# Patient Record
Sex: Male | Born: 1995 | Race: Black or African American | Hispanic: No | Marital: Married | Smoking: Current every day smoker
Health system: Southern US, Community
[De-identification: ages and names within clinical notes are randomized; demographics above are authoritative.]

## PROBLEM LIST (undated history)

## (undated) DIAGNOSIS — F419 Anxiety disorder, unspecified: Secondary | ICD-10-CM

## (undated) DIAGNOSIS — R569 Unspecified convulsions: Secondary | ICD-10-CM

---
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
## 2013-08-06 ENCOUNTER — Emergency Department: Payer: Self-pay | Admitting: Emergency Medicine

## 2014-03-11 ENCOUNTER — Emergency Department: Payer: Self-pay | Admitting: Emergency Medicine

## 2014-06-06 ENCOUNTER — Emergency Department: Payer: Self-pay | Admitting: Internal Medicine

## 2015-03-01 ENCOUNTER — Emergency Department: Payer: No Typology Code available for payment source

## 2015-03-01 ENCOUNTER — Emergency Department
Admission: EM | Admit: 2015-03-01 | Discharge: 2015-03-01 | Disposition: A | Discharge status: Home / Self Care | Payer: No Typology Code available for payment source | Attending: Emergency Medicine | Admitting: Emergency Medicine

## 2015-03-01 DIAGNOSIS — S20211A Contusion of right front wall of thorax, initial encounter: Secondary | ICD-10-CM | POA: Insufficient documentation

## 2015-03-01 DIAGNOSIS — S0083XA Contusion of other part of head, initial encounter: Secondary | ICD-10-CM | POA: Diagnosis not present

## 2015-03-01 DIAGNOSIS — Z720 Tobacco use: Secondary | ICD-10-CM | POA: Insufficient documentation

## 2015-03-01 DIAGNOSIS — Y92410 Unspecified street and highway as the place of occurrence of the external cause: Secondary | ICD-10-CM | POA: Insufficient documentation

## 2015-03-01 DIAGNOSIS — Y9389 Activity, other specified: Secondary | ICD-10-CM | POA: Insufficient documentation

## 2015-03-01 DIAGNOSIS — Y998 Other external cause status: Secondary | ICD-10-CM | POA: Insufficient documentation

## 2015-03-01 DIAGNOSIS — S161XXA Strain of muscle, fascia and tendon at neck level, initial encounter: Secondary | ICD-10-CM | POA: Insufficient documentation

## 2015-03-01 DIAGNOSIS — S39012A Strain of muscle, fascia and tendon of lower back, initial encounter: Secondary | ICD-10-CM

## 2015-03-01 DIAGNOSIS — S0990XA Unspecified injury of head, initial encounter: Secondary | ICD-10-CM | POA: Diagnosis present

## 2015-03-01 HISTORY — DX: Anxiety disorder, unspecified: F41.9

## 2015-03-01 MED ORDER — NAPROXEN 500 MG PO TABS: 500.0000 mg | ORAL_TABLET | Freq: Two times a day (BID) | ORAL | Status: DC

## 2015-03-01 MED ORDER — CYCLOBENZAPRINE HCL 10 MG PO TABS: 10.0000 mg | ORAL_TABLET | Freq: Every day | ORAL | Status: DC
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
## 2015-03-01 NOTE — Discharge Instructions (Signed)
Cervical Sprain  A cervical sprain is an injury in the neck in which the strong, fibrous tissues (ligaments) that connect your neck bones stretch or tear. Cervical sprains can range from mild to severe. Severe cervical sprains can cause the neck vertebrae to be unstable. This can lead to damage of the spinal cord and can result in serious nervous system problems. The amount of time it takes for a cervical sprain to get better depends on the cause and extent of the injury. Most cervical sprains heal in 1 to 3 weeks.  CAUSES   Severe cervical sprains may be caused by:    Contact sport injuries (such as from football, rugby, wrestling, hockey, auto racing, gymnastics, diving, martial arts, or boxing).    Motor vehicle collisions.    Whiplash injuries. This is an injury from a sudden forward and backward whipping movement of the head and neck.   Falls.   Mild cervical sprains may be caused by:    Being in an awkward position, such as while cradling a telephone between your ear and shoulder.    Sitting in a chair that does not offer proper support.    Working at a poorly designed computer station.    Looking up or down for long periods of time.   SYMPTOMS    Pain, soreness, stiffness, or a burning sensation in the front, back, or sides of the neck. This discomfort may develop immediately after the injury or slowly, 24 hours or more after the injury.    Pain or tenderness directly in the middle of the back of the neck.    Shoulder or upper back pain.    Limited ability to move the neck.    Headache.    Dizziness.    Weakness, numbness, or tingling in the hands or arms.    Muscle spasms.    Difficulty swallowing or chewing.    Tenderness and swelling of the neck.   DIAGNOSIS   Most of the time your health care provider can diagnose a cervical sprain by taking your history and doing a physical exam. Your health care provider will ask about previous neck injuries and any known neck  problems, such as arthritis in the neck. X-rays may be taken to find out if there are any other problems, such as with the bones of the neck. Other tests, such as a CT scan or MRI, may also be needed.   TREATMENT   Treatment depends on the severity of the cervical sprain. Mild sprains can be treated with rest, keeping the neck in place (immobilization), and pain medicines. Severe cervical sprains are immediately immobilized. Further treatment is done to help with pain, muscle spasms, and other symptoms and may include:   Medicines, such as pain relievers, numbing medicines, or muscle relaxants.    Physical therapy. This may involve stretching exercises, strengthening exercises, and posture training. Exercises and improved posture can help stabilize the neck, strengthen muscles, and help stop symptoms from returning.   HOME CARE INSTRUCTIONS    Put ice on the injured area.     Put ice in a plastic bag.     Place a towel between your skin and the bag.     Leave the ice on for 15-20 minutes, 3-4 times a day.    If your injury was severe, you may have been given a cervical collar to wear. A cervical collar is a two-piece collar designed to keep your neck from moving while it heals.      Do not remove the collar unless instructed by your health care provider.    If you have long hair, keep it outside of the collar.    Ask your health care provider before making any adjustments to your collar. Minor adjustments may be required over time to improve comfort and reduce pressure on your chin or on the back of your head.    Ifyou are allowed to remove the collar for cleaning or bathing, follow your health care provider's instructions on how to do so safely.    Keep your collar clean by wiping it with mild soap and water and drying it completely. If the collar you have been given includes removable pads, remove them every 1-2 days and hand wash them with soap and water. Allow them to air dry. They should be completely  dry before you wear them in the collar.    If you are allowed to remove the collar for cleaning and bathing, wash and dry the skin of your neck. Check your skin for irritation or sores. If you see any, tell your health care provider.    Do not drive while wearing the collar.    Only take over-the-counter or prescription medicines for pain, discomfort, or fever as directed by your health care provider.    Keep all follow-up appointments as directed by your health care provider.    Keep all physical therapy appointments as directed by your health care provider.    Make any needed adjustments to your workstation to promote good posture.    Avoid positions and activities that make your symptoms worse.    Warm up and stretch before being active to help prevent problems.   SEEK MEDICAL CARE IF:    Your pain is not controlled with medicine.    You are unable to decrease your pain medicine over time as planned.    Your activity level is not improving as expected.   SEEK IMMEDIATE MEDICAL CARE IF:    You develop any bleeding.   You develop stomach upset.   You have signs of an allergic reaction to your medicine.    Your symptoms get worse.    You develop new, unexplained symptoms.    You have numbness, tingling, weakness, or paralysis in any part of your body.   MAKE SURE YOU:    Understand these instructions.   Will watch your condition.   Will get help right away if you are not doing well or get worse.     This information is not intended to replace advice given to you by your health care provider. Make sure you discuss any questions you have with your health care provider.     Document Released: 02/04/2007 Document Revised: 04/14/2013 Document Reviewed: 10/15/2012  Elsevier Interactive Patient Education 2016 [employer]
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
## 2015-04-03 ENCOUNTER — Emergency Department
Admission: EM | Admit: 2015-04-03 | Discharge: 2015-04-03 | Disposition: A | Discharge status: Other Acute Inpatient Hospital | Payer: Self-pay | Attending: Emergency Medicine | Admitting: Emergency Medicine

## 2015-04-03 DIAGNOSIS — F32A Depression, unspecified: Secondary | ICD-10-CM

## 2015-04-03 DIAGNOSIS — X838XXA Intentional self-harm by other specified means, initial encounter: Secondary | ICD-10-CM | POA: Insufficient documentation

## 2015-04-03 DIAGNOSIS — F1210 Cannabis abuse, uncomplicated: Secondary | ICD-10-CM | POA: Insufficient documentation

## 2015-04-03 DIAGNOSIS — Y998 Other external cause status: Secondary | ICD-10-CM | POA: Insufficient documentation

## 2015-04-03 DIAGNOSIS — Y9389 Activity, other specified: Secondary | ICD-10-CM | POA: Insufficient documentation

## 2015-04-03 DIAGNOSIS — Z880 Allergy status to penicillin: Secondary | ICD-10-CM | POA: Insufficient documentation

## 2015-04-03 DIAGNOSIS — Y9289 Other specified places as the place of occurrence of the external cause: Secondary | ICD-10-CM | POA: Insufficient documentation

## 2015-04-03 DIAGNOSIS — Z046 Encounter for general psychiatric examination, requested by authority: Secondary | ICD-10-CM

## 2015-04-03 DIAGNOSIS — F329 Major depressive disorder, single episode, unspecified: Secondary | ICD-10-CM | POA: Insufficient documentation

## 2015-04-03 DIAGNOSIS — F17200 Nicotine dependence, unspecified, uncomplicated: Secondary | ICD-10-CM | POA: Insufficient documentation

## 2015-04-03 DIAGNOSIS — S61512A Laceration without foreign body of left wrist, initial encounter: Secondary | ICD-10-CM | POA: Insufficient documentation

## 2015-04-03 DIAGNOSIS — R45851 Suicidal ideations: Secondary | ICD-10-CM

## 2015-04-03 DIAGNOSIS — F1310 Sedative, hypnotic or anxiolytic abuse, uncomplicated: Secondary | ICD-10-CM | POA: Insufficient documentation

## 2015-04-03 LAB — COMPREHENSIVE METABOLIC PANEL
ALT: 26 U/L (ref 17–63)
AST: 24 U/L (ref 15–41)
Albumin: 5.1 g/dL — ABNORMAL HIGH (ref 3.5–5.0)
Alkaline Phosphatase: 37 U/L — ABNORMAL LOW (ref 38–126)
Anion gap: 8 (ref 5–15)
BILIRUBIN TOTAL: 0.7 mg/dL (ref 0.3–1.2)
BUN: 11 mg/dL (ref 6–20)
CALCIUM: 9.9 mg/dL (ref 8.9–10.3)
CHLORIDE: 105 mmol/L (ref 101–111)
CO2: 28 mmol/L (ref 22–32)
CREATININE: 0.96 mg/dL (ref 0.61–1.24)
Glucose, Bld: 91 mg/dL (ref 65–99)
Potassium: 3.4 mmol/L — ABNORMAL LOW (ref 3.5–5.1)
Sodium: 141 mmol/L (ref 135–145)
TOTAL PROTEIN: 8.3 g/dL — AB (ref 6.5–8.1)

## 2015-04-03 LAB — CBC
HCT: 47.3 % (ref 40.0–52.0)
Hemoglobin: 15.6 g/dL (ref 13.0–18.0)
MCH: 30.5 pg (ref 26.0–34.0)
MCHC: 33.1 g/dL (ref 32.0–36.0)
MCV: 92.1 fL (ref 80.0–100.0)
PLATELETS: 186 10*3/uL (ref 150–440)
RBC: 5.13 MIL/uL (ref 4.40–5.90)
RDW: 13.1 % (ref 11.5–14.5)
WBC: 5.4 10*3/uL (ref 3.8–10.6)

## 2015-04-03 LAB — URINE DRUG SCREEN, QUALITATIVE (ARMC ONLY)
Amphetamines, Ur Screen: NOT DETECTED
BARBITURATES, UR SCREEN: NOT DETECTED
Benzodiazepine, Ur Scrn: POSITIVE — AB
CANNABINOID 50 NG, UR ~~LOC~~: POSITIVE — AB
Cocaine Metabolite,Ur ~~LOC~~: NOT DETECTED
MDMA (ECSTASY) UR SCREEN: NOT DETECTED
Methadone Scn, Ur: NOT DETECTED
Opiate, Ur Screen: NOT DETECTED
PHENCYCLIDINE (PCP) UR S: NOT DETECTED
TRICYCLIC, UR SCREEN: NOT DETECTED

## 2015-04-03 LAB — SALICYLATE LEVEL

## 2015-04-03 LAB — ETHANOL

## 2015-04-03 LAB — ACETAMINOPHEN LEVEL: Acetaminophen (Tylenol), Serum: <10 ug/mL — ABNORMAL LOW (ref 10–30)

## 2015-04-03 MED ORDER — NICOTINE 14 MG/24HR TD PT24
14.0000 mg | MEDICATED_PATCH | Freq: Once | TRANSDERMAL | Status: DC
  Administered 2015-04-03: 14 mg via TRANSDERMAL

## 2015-04-03 MED ORDER — NICOTINE 14 MG/24HR TD PT24
MEDICATED_PATCH | TRANSDERMAL | Status: AC
  Administered 2015-04-03: 14 mg via TRANSDERMAL
  Filled 2015-04-03: qty 1

## 2015-04-03 NOTE — ED Notes (Signed)
Patient asleep in room. No noted distress or abnormal behavior. Will continue 15 minute checks and observation by security cameras for safety. 

## 2015-04-03 NOTE — ED Notes (Signed)
[employer] officer Claudette LawsWatson who brought pt to the ED was still in the department completing the paperwork and this RN notified him about the patients ankle monitoring device and he will attempt to either get the charger or notify Probation officer that patient is here.

## 2015-04-03 NOTE — ED Notes (Signed)
Patient resting quietly in room. Does not show any signs of distress at the moment. Patient seems to be more calm and cooperative with the plan of care. Maintained on 15 minute checks and observation by security camera for safety.

## 2015-04-03 NOTE — ED Notes (Signed)
BEHAVIORAL HEALTH ROUNDING  Patient sleeping: No.  Patient alert and oriented: yes  Behavior appropriate: Yes. ; If no, describe:  Nutrition and fluids offered: Yes  Toileting and hygiene offered: Yes  Sitter present: not applicable  Law enforcement present: Yes ODS  

## 2015-04-03 NOTE — ED Notes (Signed)
Patient currently meeting with visitor. No signs of distress noted. Maintained on 15 minute checks and observation by security camera for safety.

## 2015-04-03 NOTE — ED Notes (Signed)
BEHAVIORAL HEALTH ROUNDING Patient sleeping: No Patient alert and oriented: Yes Behavior appropriate: Yes.  ; If no, describe: Nutrition and fluids offered: Yes Toileting and hygiene offered: Yes  Sitter present: no Law enforcement present: Yes  and ODS  ENVIRONMENTAL ASSESSMENT Potentially harmful objects out of patient reach: Yes.   Personal belongings secured: Yes.   Patient dressed in hospital provided attire only: Yes.   Plastic bags out of patient reach: Yes.   Patient care equipment (cords, cables, call bells, lines, and drains) shortened, removed, or accounted for: Yes.   Equipment and supplies removed from bottom of stretcher: Yes.   Potentially toxic materials out of patient reach: Yes.   Sharps container removed or out of patient reach: Yes.     ED BHU PLACEMENT JUSTIFICATION Is the patient under IVC or is there intent for IVC: Yes Is the patient medically cleared: Yes.   Is there vacancy in the ED BHU: Yes.   Is the population mix appropriate for patient: Yes.   Is the patient awaiting placement in inpatient or outpatient setting: No. Has the patient had a psychiatric consult: Yes.   Survey of unit performed for contraband, proper placement and condition of furniture, tampering with fixtures in bathroom, shower, and each patient room: Yes.  ; Findings: all clear  APPEARANCE/BEHAVIOR calm, cooperative and adequate rapport can be established NEURO ASSESSMENT Orientation: time, place and person Hallucinations: No.None noted (Hallucinations) Speech: Normal Gait: normal RESPIRATORY ASSESSMENT Normal expansion.  Clear to auscultation.  No rales, rhonchi, or wheezing. CARDIOVASCULAR ASSESSMENT regular rate and rhythm, S1, S2 normal, no murmur, click, rub or gallop GASTROINTESTINAL ASSESSMENT soft, nontender, BS WNL, no r/g EXTREMITIES normal strength, tone, and muscle mass, no deformities, no erythema, induration, or nodules, no evidence of joint effusion, ROM of all  joints is normal, no evidence of joint instability PLAN OF CARE Provide calm/safe environment. Vital signs assessed twice daily. ED BHU Assessment once each 12-hour shift. Collaborate with intake RN daily or as condition indicates. Assure the ED provider has rounded once each shift. Provide and encourage hygiene. Provide redirection as needed. Assess for escalating behavior; address immediately and inform ED provider.  Assess family dynamic and appropriateness for visitation as needed: Yes.  ; If necessary, describe findings: na Educate the patient/family about BHU procedures/visitation: Yes.  ; If necessary, describe findings: na

## 2015-04-03 NOTE — ED Notes (Signed)
Before discharge nurse applied steri strips to patient wounds. Wounds were clean, dry and intact. Probation officer notified to place bracelet back on patient. Patient was escorted by security to lobby to have bracelet placed by probation officer under ED officer supervision.
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
## 2015-04-03 NOTE — BH Assessment (Signed)
Assessment Note  James Turner is an 19 y.o. male presenting to ED under IVC with multiple self-inflicted lacerations to his left wrist. Pt denies SI and states "That's the only reason why I cut my self, cause I was looking for attention". Pt denies HI and hallucinations. Pt reports hx GAD. Pt reports that he is currently on probation and is now unable to secure employment due to recently obtained criminal record. Pt reports difficulty sleeping due to anxiety and racing thoughts. Pt denies any SA hx however, references obtaining "something" from friends to assist with anxiety and sleep. Pt has family hx of suicide (brother). Pt reports no difficulties performing ADLs.  Diagnosis: GAD (Pt Report)  Past Medical History:  Past Medical History  Diagnosis Date  . Anxiety     No past surgical history on file.  Family History: No family history on file.  Social History:  reports that he has been smoking.  He does not have any smokeless tobacco history on file. He reports that he does not drink alcohol or use illicit drugs.  Additional Social History:  Alcohol / Drug Use Pain Medications: None Reported Prescriptions: None Reported Over the Counter: None Reported History of alcohol / drug use?: No history of alcohol / drug abuse  CIWA: CIWA-Ar BP: 114/61 mmHg Pulse Rate: 79 COWS:    Allergies:  Allergies  Allergen Reactions  . Penicillins Hives    Has patient had a PCN reaction causing immediate rash, facial/tongue/throat swelling, SOB or lightheadedness with hypotension: no:30480221} Has patient had a PCN reaction causing severe rash involving mucus membranes or skin necrosis: no:[id number]} Has patient had a PCN reaction that required hospitalization no:30480221} Has patient had a PCN reaction occurring within the last 10 years: no:[id number]} If all of the above answers are "NO", then may proceed with Cephalosporin use.     Home Medications:  (Not in a hospital  admission)  OB/GYN Status:  No LMP for male patient.  General Assessment Data Location of Assessment: [facility] [address] ED TTS Assessment: In system Is this a Tele or Face-to-Face Assessment?: Face-to-Face Is this an Initial Assessment or a Re-assessment for this encounter?: Initial Assessment Marital status: Single Maiden name: NA Is patient pregnant?: No Pregnancy Status: No Living Arrangements: Spouse/significant other Can pt return to current living arrangement?: Yes Admission Status: Involuntary Is patient capable of signing voluntary admission?: No Referral Source: Other Insurance type: None  Medical Screening Exam [facility] Walk-in ONLY) Medical Exam completed: Yes  Crisis Care Plan Living Arrangements: Spouse/significant other Name of Psychiatrist: None Name of Therapist: None  Education Status Is patient currently in school?: Yes Current Grade: GED Program Highest grade of school patient has completed: [employer] High [employer] Name of school: [employer] Contact person: None Provided  Risk to self with the past 6 months Suicidal Ideation: No Has patient been a risk to self within the past 6 months prior to admission? : No Suicidal Intent: No Has patient had any suicidal intent within the past 6 months prior to admission? : No Is patient at risk for suicide?: No Suicidal Plan?: No Has patient had any suicidal plan within the past 6 months prior to admission? 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 : No Thoughts of Harm to Others: No Current Homicidal Intent: No Current Homicidal Plan: No Access to Homicidal Means: No Identified Victim: NA History of harm to others?: No Assessment of Violence: None Noted Violent Behavior Description: NA Does patient have access to weapons?: Yes (Comment) (Access to sharp objects) Criminal Charges Pending?: Yes Describe Pending Criminal Charges: Carrying Concealed Gun Does patient have a court date: Yes Court Date: 05/02/14 Is patient on probation?: Yes  Psychosis Hallucinations: None noted Delusions: None noted  Mental Status Report Appearance/Hygiene: Unable to Assess (Pt remained in bed under covers) Eye Contact: Fair Motor Activity: Unremarkable Speech: Logical/coherent Level of Consciousness: Quiet/awake Mood: Depressed, Anxious, Helpless, Irritable Affect: Depressed, Angry, Appropriate to circumstance Anxiety Level: Minimal Thought Processes: Coherent, Relevant Judgement: Partial Orientation: Person, Place, Time, Situation, Appropriate for developmental age Obsessive Compulsive Thoughts/Behaviors: None  Cognitive Functioning Concentration: Normal Memory: Recent Intact, Remote Intact IQ: Average Insight: Fair Impulse Control: Poor Appetite: Fair Weight Loss:  (While incarcerated) Weight Gain: 0 Sleep: Decreased Total Hours of Sleep:  (Not Provided) Vegetative Symptoms:  (Not Reported)  ADLScreening [facility] Services) Patient's cognitive ability adequate to safely complete daily activities?: Yes Patient able to express need for assistance with  ADLs?: Yes Independently performs ADLs?: Yes (appropriate for developmental age)  Prior Inpatient Therapy Prior Inpatient Therapy: No Prior Therapy Dates: NA Prior Therapy Facilty/Provider(s): NA Reason for Treatment: NA  Prior Outpatient Therapy Prior Outpatient Therapy: No Prior Therapy Dates: NA Prior Therapy Facilty/Provider(s): NA Reason for Treatment: NA Does patient have an ACCT team?: No Does patient have Intensive In-House Services?  : No Does patient have [facility] services? : No Does patient have P4CC services?: No  ADL Screening (condition at time of admission) Patient's cognitive ability adequate to safely complete daily activities?: Yes Is the patient deaf or have difficulty hearing?: No Does the patient have difficulty seeing, even when wearing glasses/contacts?: No Does the patient have difficulty concentrating, remembering, or making decisions?: No Patient able to express need for assistance with ADLs?: Yes Does the patient have difficulty dressing or bathing?: No Independently performs ADLs?: Yes (appropriate for developmental age) Does the patient have difficulty walking or climbing stairs?: No Weakness of Legs: None Weakness of Arms/Hands: None  Home Assistive Devices/Equipment Home Assistive Devices/Equipment: None  Therapy Consults (therapy consults require a physician order) PT Evaluation Needed: No OT Evalulation Needed: No SLP Evaluation Needed: No Abuse/Neglect Assessment (Assessment to be complete while patient is alone) Physical Abuse: Denies Verbal Abuse: Denies Sexual Abuse: Denies Exploitation of patient/patient's resources: Denies Self-Neglect: Denies Values / Beliefs Cultural Requests During Hospitalization: None Spiritual Requests During Hospitalization: None Consults Spiritual Care Consult Needed: No Social Work Consult Needed: No Merchant navy officer (For Healthcare) Does patient have an advance directive?: No Would patient like  information on creating an advanced directive?: No - patient declined information    Additional Information 1:1 In Past 12 Months?: No CIRT Risk: No Elopement Risk: No Does patient have medical clearance?: No     Disposition:  Disposition Initial Assessment Completed for this Encounter: Yes Disposition of  Patient: Other dispositions (Psych MD Consult)  On Site Evaluation by:   Reviewed with Physician:    Ebin Palazzi J [place] 04/03/2015 3:04 AM

## 2015-04-03 NOTE — ED Notes (Signed)
ENVIRONMENTAL ASSESSMENT Potentially harmful objects out of patient reach: Yes Personal belongings secured: Yes Patient dressed in hospital provided attire only: Yes Plastic bags out of patient reach: Yes Patient care equipment (cords, cables, call bells, lines, and drains) shortened, removed, or accounted for: Yes Equipment and supplies removed from bottom of stretcher: Yes Potentially toxic materials out of patient reach: Yes Sharps container removed or out of patient reach: Yes  Patient sleeping in room. Patient shows no signs of distress. Maintained on 15 minute checks and observation by security camera for safety.  

## 2015-04-03 NOTE — ED Notes (Signed)
Pt presents for IVC. Pt reports cutting wrists tonight. Superficial lacerations to front and back of left wrist.

## 2015-04-03 NOTE — ED Notes (Addendum)
Patient spoke more with nurse about concerns for being discharged today. Nurse assured patient that psychiatrist would evaluate him and gave him techniques to relieve anxiety. Maintained on 15 minute checks and observation by security camera for safety.

## 2015-04-03 NOTE — ED Notes (Addendum)
Pt reports he has been feeling down on himself because he has felony conviction and is unable to get a job. Pt states he started cutting his wrist tonight because of that but states he was just attention seeking and not really wanting to harm himself. Pt did express to this RN that his brother committed suicide when he (the patient) was around 19 years old. Pt also reports to this RN that he has a tracking device on his ankle because of his felony conviction. Device noted to be giving an alarm to charge the battery at this time and patient concerned about getting in trouble because of not being at home. Explained to patient that we could notify the office that monitors him and let them know he was here if he wanted us to. Pt agreed that we could notify the parole office.

## 2015-04-03 NOTE — ED Provider Notes (Signed)
[facility] Emergency Department Provider Note  ____________________________________________  Time seen: Approximately 2:19 AM  I have reviewed the triage vital signs and the nursing notes.   HISTORY  Chief Complaint Suicidal    HPI James Turner is a 19 y.o. male with a past medical history of anxiety per his report who states that he just wanted some attention tonight so he repeatedly cut his left wrist.  However he was placed under involuntary commitment for the self injury and suicidal ideation and attempt.He denies any suicidal ideation but does have numerous superficial cuts on his left wrist.  He says he is going through "some stuff" that is difficult to do with and that is leading to his worsening mental status.  The onset has been gradual and the symptoms are severe.  He denies any hallucinations.  He denies any desire to hurt anyone else.  He is denying suicidal ideation at this time.  Of note, the patient does not think he is up-to-date on his tetanus but he is refusing the shot because he feels that it is dangerous and that "sometimes they put stuff in it".   Past Medical History  Diagnosis Date  . Anxiety     There are no active problems to display for this patient.   No past surgical history on file.  No current outpatient prescriptions on file.  Allergies Penicillins  No family history on file.  Social History Social History  Substance Use Topics  . Smoking status: Current Every Day Smoker  . Smokeless tobacco: Not on file  . Alcohol Use: No    Review of Systems Constitutional: No fever/chills Eyes: No visual changes. ENT: No sore throat. Cardiovascular: Denies chest pain. Respiratory: Denies shortness of breath. Gastrointestinal: No abdominal pain.  No nausea, no vomiting.  No diarrhea.  No constipation. Genitourinary: Negative for dysuria. Musculoskeletal: Negative for back pain. Skin: Numerous superficial wounds to  his left wrist Neurological: Negative for headaches, focal weakness or numbness.  10-point ROS otherwise negative.  ____________________________________________   PHYSICAL EXAM:  VITAL SIGNS: ED Triage Vitals  Enc Vitals Group     BP 04/03/15 0026 114/61 mmHg     Pulse Rate 04/03/15 0026 79     Resp 04/03/15 0026 16     Temp 04/03/15 0026 98.4 F (36.9 C)     Temp Source 04/03/15 0026 Oral     SpO2 04/03/15 0026 97 %     Weight 04/03/15 0026 155 lb (70.308 kg)     Height 04/03/15 0026  (1.778 m)     Head Cir --      Peak Flow --      Pain Score --      Pain Loc --      Pain Edu? --      Excl. in GC? --     Constitutional: Alert and oriented. Well appearing and in no acute distress. Eyes: Conjunctivae are normal. PERRL. EOMI. Head: Atraumatic. Nose: No congestion/rhinnorhea. Mouth/Throat: Mucous membranes are moist.  Oropharynx non-erythematous. Neck: No stridor.   Cardiovascular: Normal rate, regular rhythm. Grossly normal heart sounds.  Good peripheral circulation. Respiratory: Normal respiratory effort.  No retractions. Lungs CTAB. Gastrointestinal: Soft and nontender. No distention. No abdominal bruits. No CVA tenderness. Musculoskeletal: No lower extremity tenderness nor edema.  No joint effusions. Neurologic:  Normal speech and language. No gross focal neurologic deficits are appreciated.  Skin:  Skin is warm and dry.  Numerous linear superficial wounds to his  left wrist, nothing down into the subcutaneous tissue. Psychiatric: Mood and affect are flat. Speech and behavior are normal.  Denies SI/HI  ____________________________________________   LABS (all labs ordered are listed, but only abnormal results are displayed)  Labs Reviewed  COMPREHENSIVE METABOLIC PANEL - Abnormal; Notable for the following:    Potassium 3.4 (*)    Total Protein 8.3 (*)    Albumin 5.1 (*)    Alkaline Phosphatase 37 (*)    All other components within normal limits   ACETAMINOPHEN LEVEL - Abnormal; Notable for the following:    Acetaminophen (Tylenol), Serum <10 (*)    All other components within normal limits  URINE DRUG SCREEN, QUALITATIVE ([facility] ONLY) - Abnormal; Notable for the following:    Cannabinoid 50 Ng, Ur [place] POSITIVE (*)    Benzodiazepine, Ur Scrn POSITIVE (*)    All other components within normal limits  ETHANOL  SALICYLATE LEVEL  CBC   ____________________________________________  EKG  Not indicated ____________________________________________  RADIOLOGY   No results found.  ____________________________________________   PROCEDURES  Procedure(s) performed: None  Critical Care performed: No ____________________________________________   INITIAL IMPRESSION / ASSESSMENT AND PLAN / ED COURSE  Pertinent labs & imaging results that were available during my care of the patient were reviewed by me and considered in my medical decision making (see chart for details).  We will irrigate wound and closed with Steri-Strips, all the wounds are superficial.  Up holding IVC.  Orders in for psych and TTS.  Patient is refusing Tdap.  Medically cleared.  ____________________________________________  FINAL CLINICAL IMPRESSION(S) / ED DIAGNOSES  Final diagnoses:  Depression  Suicidal ideation  Self-harm, initial encounter  Wrist laceration, left, initial encounter  Involuntary commitment      NEW MEDICATIONS STARTED DURING THIS VISIT:  New Prescriptions   No medications on file     Loleta Roseory Draedyn Weidinger, MD 04/03/15 [phone]

## 2015-04-03 NOTE — ED Notes (Signed)
Received call from Officer Clovis RileyMitchell of [facility] [employer] who reports he had to go to the patients house due to the monitor alarming and that he was told the patient was here and he would come by and remove the device at this time.

## 2015-04-03 NOTE — ED Notes (Signed)
BEHAVIORAL HEALTH ROUNDING Patient sleeping: Yes.   Patient alert and oriented: not applicable SLEEPING Behavior appropriate: Yes.  ; If no, describe: SLEEPING Nutrition and fluids offered: No SLEEPING Toileting and hygiene offered: NoSLEEPING Sitter present: not applicable Law enforcement present: Yes ODS 

## 2015-04-03 NOTE — ED Notes (Signed)
Patient is currently standing in the dayroom speaking with mom on the phone. Patient then talked to staff stating that he really wants to go home in order to prepare for his job interview tomorrow. He adamantly states that he is not suicidal, he needs to go home, and that he simply cut his arm for attention seeking behavior. Maintained on 15 minute checks and observation by security camera for safety.

## 2015-04-03 NOTE — ED Notes (Signed)
Patient mother Learta CoddingJabrina Curto [phone] called to ask about the status of her son. She states that he is just going through a rough time right now and that he cut his arm to attention-seek and not because he had severe suicidal ideation. Mother wishes to be informed of any updates throughout the day and hopes that he will be able to be discharged today.

## 2015-04-03 NOTE — ED Notes (Signed)
Pt transfer from ED22 without incident.  Pt "this place is just like prison"  Patient assigned to appropriate care area. Patient oriented to unit/care area: Informed that, for their safety, care areas are designed for safety and monitored by security cameras at all times; and visiting hours explained to patient. Patient verbalizes understanding, and verbal contract for safety obtained.

## 2015-04-03 NOTE — ED Notes (Signed)

## 2015-04-03 NOTE — ED Notes (Addendum)
Officer Clovis RileyMitchell from [facility] [employer] in to remove ankle monitor from patient and left contact number so that device can be replaced when pt is discharged. Officer Clovis RileyMitchell requested that his office be notified when the patient is discharge or if he is moved to another facility so that the monitor can be replaced when the patient is discharged home. The 24 hour on call phone number for contact is [phone].

## 2015-04-03 NOTE — ED Notes (Signed)
Patient in room watching television. Maintained on 15 minute checks and observation by security camera for safety.

## 2015-04-03 NOTE — Discharge Instructions (Signed)
Major Depressive Disorder °Major depressive disorder is a mental illness. It also may be called clinical depression or unipolar depression. Major depressive disorder usually causes feelings of sadness, hopelessness, or helplessness. Some people with this disorder do not feel particularly sad but lose interest in doing things they used to enjoy (anhedonia). Major depressive disorder also can cause physical symptoms. It can interfere with work, school, relationships, and other normal everyday activities. The disorder varies in severity but is longer lasting and more serious than the sadness we all feel from time to time in our lives. °Major depressive disorder often is triggered by stressful life events or major life changes. Examples of these triggers include divorce, loss of your job or home, a move, and the death of a family member or close friend. Sometimes this disorder occurs for no obvious reason at all. People who have family members with major depressive disorder or bipolar disorder are at higher risk for developing this disorder, with or without life stressors. Major depressive disorder can occur at any age. It may occur just once in your life (single episode major depressive disorder). It may occur multiple times (recurrent major depressive disorder). °SYMPTOMS °People with major depressive disorder have either anhedonia or depressed mood on nearly a daily basis for at least 2 weeks or longer. Symptoms of depressed mood include: °· Feelings of sadness (blue or down in the dumps) or emptiness. °· Feelings of hopelessness or helplessness. °· Tearfulness or episodes of crying (may be observed by others). °· Irritability (children and adolescents). °In addition to depressed mood or anhedonia or both, people with this disorder have at least four of the following symptoms: °· Difficulty sleeping or sleeping too much.   °· Significant change (increase or decrease) in appetite or weight.   °· Lack of energy or  motivation. °· Feelings of guilt and worthlessness.   °· Difficulty concentrating, remembering, or making decisions. °· Unusually slow movement (psychomotor retardation) or restlessness (as observed by others).   °· Recurrent wishes for death, recurrent thoughts of self-harm (suicide), or a suicide attempt. °People with major depressive disorder commonly have persistent negative thoughts about themselves, other people, and the world. People with severe major depressive disorder may experience distorted beliefs or perceptions about the world (psychotic delusions). They also may see or hear things that are not real (psychotic hallucinations). °DIAGNOSIS °Major depressive disorder is diagnosed through an assessment by your health care provider. Your health care provider will ask about aspects of your daily life, such as mood, sleep, and appetite, to see if you have the diagnostic symptoms of major depressive disorder. Your health care provider may ask about your medical history and use of alcohol or drugs, including prescription medicines. Your health care provider also may do a physical exam and blood work. This is because certain medical conditions and the use of certain substances can cause major depressive disorder-like symptoms (secondary depression). Your health care provider also may refer you to a mental health specialist for further evaluation and treatment. °TREATMENT °It is important to recognize the symptoms of major depressive disorder and seek treatment. The following treatments can be prescribed for this disorder:   °· Medicine. Antidepressant medicines usually are prescribed. Antidepressant medicines are thought to correct chemical imbalances in the brain that are commonly associated with major depressive disorder. Other types of medicine may be added if the symptoms do not respond to antidepressant medicines alone or if psychotic delusions or hallucinations occur. °· Talk therapy. Talk therapy can be  helpful in treating major depressive disorder by providing   support, education, and guidance. Certain types of talk therapy also can help with negative thinking (cognitive behavioral therapy) and with relationship issues that trigger this disorder (interpersonal therapy). °A mental health specialist can help determine which treatment is best for you. Most people with major depressive disorder do well with a combination of medicine and talk therapy. Treatments involving electrical stimulation of the brain can be used in situations with extremely severe symptoms or when medicine and talk therapy do not work over time. These treatments include electroconvulsive therapy, transcranial magnetic stimulation, and vagal nerve stimulation. °  °This information is not intended to replace advice given to you by your health care provider. Make sure you discuss any questions you have with your health care provider. °  °Document Released: 08/04/2012 Document Revised: 04/30/2014 Document Reviewed: 08/04/2012 °Elsevier Interactive Patient Education ©2016 [employer]vier Inc. ° °Suicidal Feelings: How to Help Yourself °Suicide is the taking of one's own life. If you feel as though life is getting too tough to handle and are thinking about suicide, get help right away. To get help: °· Call your local emergency services (911 in the [place] °· Call a suicide hotline to speak with a trained counselor who understands how you are feeling. The following is a list of suicide hotlines in the [place]. For a list of hotlines in [place], visit www.suicide.org/hotlines/international/[place]-suicide-hotlines.html. °¨  1-800-273-TALK (1-800-273-8255). °¨  1-800-SUICIDE (1-800-784-2433). °¨  1-888-628-9454. This is a hotline for Spanish speakers. °¨  1-800-799-4TTY (1-800-799-4889). This is a hotline for TTY users. °¨  1-866-4-U-TREVOR (1-866-488-7386). This is a hotline for lesbian, gay, bisexual, transgender, or questioning youth. °· Contact a crisis  center or a local suicide prevention center. To find a crisis center or suicide prevention center: °¨ Call your local hospital, clinic, community service organization, mental health center, social service provider, or health department. Ask for assistance in connecting to a crisis center. °¨ Visit www.suicidepreventionlifeline.org/getinvolved/locator for a list of crisis centers in the [place], or visit www.suicideprevention.[place]/thinking-about-suicide/find-a-crisis-centre for a list of centers in [place]. °· Visit the following websites: °¨  National Suicide Prevention Lifeline: www.suicidepreventionlifeline.org °¨  Hopeline: www.hopeline.com °¨  American Foundation for Suicide Prevention: www.afsp.org °¨  The Trevor Project (for lesbian, gay, bisexual, transgender, or questioning youth): www.thetrevorproject.org °HOW CAN I HELP MYSELF FEEL BETTER? °· Promise yourself that you will not do anything drastic when you have suicidal feelings. Remember, there is hope. Many people have gotten through suicidal thoughts and feelings, and you will, too. You may have gotten through them before, and this proves that you can get through them again. °· Let family, friends, teachers, or counselors know how you are feeling. Try not to isolate yourself from those who care about you. Remember, they will want to help you. Talk with someone every day, even if you do not feel sociable. Face-to-face conversation is best. °· Call a mental health professional and see one regularly. °· Visit your primary health care provider every year. °· Eat a well-balanced diet, and space your meals so you eat regularly. °· Get plenty of rest. °· Avoid alcohol and drugs, and remove them from your home. They will only make you feel worse. °· If you are thinking of taking a lot of medicine, give your medicine to someone who can give it to you one day at a time. If you are on antidepressants and are concerned you will overdose, let your health care  provider know so he or she can give you safer medicines. Ask your mental health professional about the possible side effects of any   medicines you are taking. °· Remove weapons, poisons, knives, and anything else that could harm you from your home. °· Try to stick to routines. Follow a schedule every day. Put self-care on your schedule. °· Make a list of realistic goals, and cross them off when you achieve them. Accomplishments give a sense of worth. °· Wait until you are feeling better before doing the things you find difficult or unpleasant. °· Exercise if you are able. You will feel better if you exercise for even a half hour each day. °· Go out in the sun or into nature. This will help you recover from depression faster. If you have a favorite place to walk, go there. °· Do the things that have always given you pleasure. Play your favorite music, read a good book, paint a picture, play your favorite instrument, or do anything else that takes your mind off your depression if it is safe to do. °· Keep your living space well lit. °· When you are feeling well, write yourself a letter about tips and support that you can read when you are not feeling well. °· Remember that life's difficulties can be sorted out with help. Conditions can be treated. You can work on thoughts and strategies that serve you well. °  °This information is not intended to replace advice given to you by your health care provider. Make sure you discuss any questions you have with your health care provider. °  °Document Released: 10/14/2002 Document Revised: 04/30/2014 Document Reviewed: 08/04/2013 °Elsevier Interactive Patient Education ©2016 Elsevier Inc. ° °

## 2015-04-03 NOTE — ED Notes (Signed)
Report called to Danelle EarthlyNoel, Charity fundraiserN. Pt to be moved to ED BHU.
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
## 2015-04-03 NOTE — ED Notes (Signed)
Patient resting quietly in room. No noted distress or abnormal behaviors noted. Will continue 15 minute checks and observation by security camera for safety. 

## 2015-04-03 NOTE — ED Provider Notes (Signed)
Patient is cleared for discharge by Dr. Guss [place], medically stable.  James FilbertJonathan E Damaree Sargent, MD 04/03/15 [phone]

## 2015-04-03 NOTE — ED Notes (Signed)
Patient currently denies SI/HI/AVH and pain. All belongings returned to patient. Patient discharged to home with mother.

## 2015-09-09 ENCOUNTER — Telehealth (HOSPITAL_BASED_OUTPATIENT_CLINIC_OR_DEPARTMENT_OTHER): Payer: Self-pay

## 2015-09-09 ENCOUNTER — Emergency Department: Payer: No Typology Code available for payment source

## 2015-09-09 ENCOUNTER — Emergency Department
Admission: EM | Admit: 2015-09-09 | Discharge: 2015-09-09 | Disposition: A | Discharge status: Home / Self Care | Payer: No Typology Code available for payment source | Attending: Emergency Medicine | Admitting: Emergency Medicine

## 2015-09-09 ENCOUNTER — Encounter: Payer: Self-pay | Admitting: *Deleted

## 2015-09-09 DIAGNOSIS — Y92410 Unspecified street and highway as the place of occurrence of the external cause: Secondary | ICD-10-CM | POA: Diagnosis not present

## 2015-09-09 DIAGNOSIS — F17200 Nicotine dependence, unspecified, uncomplicated: Secondary | ICD-10-CM | POA: Insufficient documentation

## 2015-09-09 DIAGNOSIS — Y999 Unspecified external cause status: Secondary | ICD-10-CM | POA: Insufficient documentation

## 2015-09-09 DIAGNOSIS — S161XXA Strain of muscle, fascia and tendon at neck level, initial encounter: Secondary | ICD-10-CM | POA: Diagnosis not present

## 2015-09-09 DIAGNOSIS — R0781 Pleurodynia: Secondary | ICD-10-CM | POA: Diagnosis not present

## 2015-09-09 DIAGNOSIS — Y939 Activity, unspecified: Secondary | ICD-10-CM | POA: Diagnosis not present

## 2015-09-09 DIAGNOSIS — M542 Cervicalgia: Secondary | ICD-10-CM | POA: Diagnosis present

## 2015-09-09 MED ORDER — CYCLOBENZAPRINE HCL 10 MG PO TABS: 10.0000 mg | ORAL_TABLET | Freq: Three times a day (TID) | ORAL | Status: DC | PRN

## 2015-09-09 MED ORDER — IBUPROFEN 800 MG PO TABS: 800.0000 mg | ORAL_TABLET | Freq: Three times a day (TID) | ORAL | Status: AC | PRN
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
## 2015-09-09 NOTE — ED Notes (Signed)
Pt discharged home after verbalizing understanding of discharge instructions; nad noted. 

## 2015-10-21 ENCOUNTER — Ambulatory Visit: Payer: Self-pay

## 2015-11-17 ENCOUNTER — Emergency Department: Payer: Self-pay

## 2015-11-17 ENCOUNTER — Emergency Department
Admission: EM | Admit: 2015-11-17 | Discharge: 2015-11-17 | Disposition: A | Discharge status: Home / Self Care | Payer: Self-pay | Attending: Emergency Medicine | Admitting: Emergency Medicine

## 2015-11-17 ENCOUNTER — Encounter: Payer: Self-pay | Admitting: Emergency Medicine

## 2015-11-17 DIAGNOSIS — R569 Unspecified convulsions: Secondary | ICD-10-CM | POA: Insufficient documentation

## 2015-11-17 DIAGNOSIS — F17200 Nicotine dependence, unspecified, uncomplicated: Secondary | ICD-10-CM | POA: Insufficient documentation

## 2015-11-17 LAB — BASIC METABOLIC PANEL
ANION GAP: 27 — AB (ref 5–15)
ANION GAP: 10 (ref 5–15)
BUN: 9 mg/dL (ref 6–20)
BUN: 9 mg/dL (ref 6–20)
CALCIUM: 10.2 mg/dL (ref 8.9–10.3)
CALCIUM: 8.9 mg/dL (ref 8.9–10.3)
CO2: 13 mmol/L — ABNORMAL LOW (ref 22–32)
CO2: 21 mmol/L — AB (ref 22–32)
CREATININE: 1.54 mg/dL — AB (ref 0.61–1.24)
Chloride: 98 mmol/L — ABNORMAL LOW (ref 101–111)
Chloride: 106 mmol/L (ref 101–111)
Creatinine, Ser: 1.18 mg/dL (ref 0.61–1.24)
GLUCOSE: 141 mg/dL — AB (ref 65–99)
Glucose, Bld: 79 mg/dL (ref 65–99)
POTASSIUM: 3.6 mmol/L (ref 3.5–5.1)
Potassium: 3 mmol/L — ABNORMAL LOW (ref 3.5–5.1)
Sodium: 138 mmol/L (ref 135–145)
Sodium: 137 mmol/L (ref 135–145)

## 2015-11-17 LAB — URINALYSIS COMPLETE WITH MICROSCOPIC (ARMC ONLY)
BILIRUBIN URINE: NEGATIVE
GLUCOSE, UA: NEGATIVE mg/dL
KETONES UR: NEGATIVE mg/dL
Leukocytes, UA: NEGATIVE
NITRITE: NEGATIVE
Protein, ur: 100 mg/dL — AB
Specific Gravity, Urine: 1.02 (ref 1.005–1.030)
Squamous Epithelial / LPF: NONE SEEN
pH: 5 (ref 5.0–8.0)

## 2015-11-17 LAB — BLOOD GAS, VENOUS
Acid-base deficit: 1.3 mmol/L (ref 0.0–2.0)
Bicarbonate: 26.4 mEq/L (ref 21.0–28.0)
PH VEN: 7.29 — AB (ref 7.320–7.430)
Patient temperature: 37
pCO2, Ven: 55 mmHg (ref 44.0–60.0)

## 2015-11-17 LAB — URINE DRUG SCREEN, QUALITATIVE (ARMC ONLY)
AMPHETAMINES, UR SCREEN: NOT DETECTED
BENZODIAZEPINE, UR SCRN: POSITIVE — AB
Barbiturates, Ur Screen: NOT DETECTED
COCAINE METABOLITE, UR ~~LOC~~: NOT DETECTED
Cannabinoid 50 Ng, Ur ~~LOC~~: POSITIVE — AB
MDMA (ECSTASY) UR SCREEN: NOT DETECTED
METHADONE SCREEN, URINE: NOT DETECTED
OPIATE, UR SCREEN: NOT DETECTED
PHENCYCLIDINE (PCP) UR S: NOT DETECTED
Tricyclic, Ur Screen: NOT DETECTED

## 2015-11-17 LAB — CBC
HCT: 51.9 % (ref 40.0–52.0)
HEMOGLOBIN: 16.4 g/dL (ref 13.0–18.0)
MCH: 30 pg (ref 26.0–34.0)
MCHC: 31.6 g/dL — AB (ref 32.0–36.0)
MCV: 94.8 fL (ref 80.0–100.0)
PLATELETS: 236 10*3/uL (ref 150–440)
RBC: 5.48 MIL/uL (ref 4.40–5.90)
RDW: 14.1 % (ref 11.5–14.5)
WBC: 12 10*3/uL — ABNORMAL HIGH (ref 3.8–10.6)

## 2015-11-17 MED ORDER — ONDANSETRON HCL 4 MG/2ML IJ SOLN
4.0000 mg | Freq: Once | INTRAMUSCULAR | Status: AC
  Administered 2015-11-17: 4 mg via INTRAVENOUS

## 2015-11-17 MED ORDER — SODIUM CHLORIDE 0.9 % IV BOLUS (SEPSIS)
1000.0000 mL | Freq: Once | INTRAVENOUS | Status: AC
  Administered 2015-11-17: 1000 mL via INTRAVENOUS

## 2015-11-17 MED ORDER — ONDANSETRON HCL 4 MG/2ML IJ SOLN
INTRAMUSCULAR | Status: AC
  Administered 2015-11-17: 4 mg via INTRAVENOUS
  Filled 2015-11-17: qty 2
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
## 2015-11-17 NOTE — ED Notes (Signed)
Pt attempting to give urine sample at this time, states he may not be able to but will try
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
## 2015-11-17 NOTE — ED Notes (Signed)
Pt actively vomiting, zofran retrieved and to be administered

## 2015-11-17 NOTE — ED Notes (Signed)
Pt unable to give urine sample, states he needs to drink water, per Dr. Lenard Lance, okay to give water.  Pt taken to CT at this time

## 2015-11-17 NOTE — ED Notes (Signed)
Pt returned from CT, pt given water per pt request to help give urine sample.  Family asked to inform this nurse when pt able to give sample, family verbalized understanding

## 2015-11-17 NOTE — ED Notes (Signed)
Pt discharged to home.  Family member driving.  Discharge instructions reviewed.  Verbalized understanding.  No questions or concerns at this time.  Teach back verified.  Pt in NAD.  No items left in ED.   

## 2016-08-14 ENCOUNTER — Emergency Department: Payer: Medicaid Other

## 2016-08-14 ENCOUNTER — Encounter: Payer: Self-pay | Admitting: Medical Oncology

## 2016-08-14 ENCOUNTER — Emergency Department
Admission: EM | Admit: 2016-08-14 | Discharge: 2016-08-14 | Disposition: A | Discharge status: Home / Self Care | Payer: Medicaid Other | Attending: Emergency Medicine | Admitting: Emergency Medicine

## 2016-08-14 DIAGNOSIS — R51 Headache: Secondary | ICD-10-CM | POA: Diagnosis not present

## 2016-08-14 DIAGNOSIS — Y999 Unspecified external cause status: Secondary | ICD-10-CM | POA: Insufficient documentation

## 2016-08-14 DIAGNOSIS — S3992XA Unspecified injury of lower back, initial encounter: Secondary | ICD-10-CM | POA: Diagnosis present

## 2016-08-14 DIAGNOSIS — Y92410 Unspecified street and highway as the place of occurrence of the external cause: Secondary | ICD-10-CM | POA: Insufficient documentation

## 2016-08-14 DIAGNOSIS — Y939 Activity, unspecified: Secondary | ICD-10-CM | POA: Insufficient documentation

## 2016-08-14 DIAGNOSIS — Z79899 Other long term (current) drug therapy: Secondary | ICD-10-CM | POA: Diagnosis not present

## 2016-08-14 DIAGNOSIS — F17200 Nicotine dependence, unspecified, uncomplicated: Secondary | ICD-10-CM | POA: Insufficient documentation

## 2016-08-14 DIAGNOSIS — S39012A Strain of muscle, fascia and tendon of lower back, initial encounter: Secondary | ICD-10-CM | POA: Diagnosis not present

## 2016-08-14 MED ORDER — CYCLOBENZAPRINE HCL 5 MG PO TABS: 5.0000 mg | ORAL_TABLET | Freq: Three times a day (TID) | ORAL | 0 refills | Status: AC | PRN
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
## 2016-08-14 NOTE — Discharge Instructions (Signed)
Your exam and x-ray are normal today. Take the prescription muscle relaxant as directed. Take ibuprofen as needed for non-drowsy pain relief. Apply ice or heat to any sore muscles. Follow-up [facility] for ongoing symptoms.
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
## 2017-05-18 ENCOUNTER — Encounter: Payer: Self-pay | Admitting: Intensive Care

## 2017-05-18 ENCOUNTER — Emergency Department
Admission: EM | Admit: 2017-05-18 | Discharge: 2017-05-18 | Disposition: A | Discharge status: Home / Self Care | Payer: Medicaid Other | Attending: Emergency Medicine | Admitting: Emergency Medicine

## 2017-05-18 DIAGNOSIS — M7918 Myalgia, other site: Secondary | ICD-10-CM | POA: Diagnosis not present

## 2017-05-18 DIAGNOSIS — Z79899 Other long term (current) drug therapy: Secondary | ICD-10-CM | POA: Insufficient documentation

## 2017-05-18 DIAGNOSIS — M25512 Pain in left shoulder: Secondary | ICD-10-CM | POA: Insufficient documentation

## 2017-05-18 DIAGNOSIS — F17200 Nicotine dependence, unspecified, uncomplicated: Secondary | ICD-10-CM | POA: Insufficient documentation

## 2017-05-18 HISTORY — DX: Unspecified convulsions: R56.9
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
## 2017-06-18 ENCOUNTER — Other Ambulatory Visit: Payer: Self-pay

## 2017-06-18 ENCOUNTER — Emergency Department
Admission: EM | Admit: 2017-06-18 | Discharge: 2017-06-18 | Disposition: A | Discharge status: Home / Self Care | Payer: No Typology Code available for payment source | Attending: Emergency Medicine | Admitting: Emergency Medicine

## 2017-06-18 DIAGNOSIS — Z041 Encounter for examination and observation following transport accident: Secondary | ICD-10-CM | POA: Diagnosis present

## 2017-06-18 DIAGNOSIS — F17200 Nicotine dependence, unspecified, uncomplicated: Secondary | ICD-10-CM | POA: Diagnosis not present

## 2017-06-18 DIAGNOSIS — Z79899 Other long term (current) drug therapy: Secondary | ICD-10-CM | POA: Diagnosis not present
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
## 2017-06-18 NOTE — Discharge Instructions (Signed)
Return to the emergency room for any new or worrisome symptoms including if you have concerns that you have an injury that was not seen here.  You declined any x-rays or further workup which is certainly her choice but does limit our care here.  Fortunately we do not see anything obvious that requires imaging at this time, however, if there is something that is bothering you including severe headache, abdominal pain, weakness, concern for fracture of an extremity or any other concerns please return to the emergency department.
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
